# Patient Record
Sex: Female | Born: 1935 | Race: Black or African American | Hispanic: No | Smoking: Never smoker
Health system: Southern US, Community
[De-identification: ages and names within clinical notes are randomized; demographics above are authoritative.]

## PROBLEM LIST (undated history)

## (undated) DIAGNOSIS — I10 Essential (primary) hypertension: Secondary | ICD-10-CM

## (undated) DIAGNOSIS — E119 Type 2 diabetes mellitus without complications: Secondary | ICD-10-CM

## (undated) DIAGNOSIS — I2510 Atherosclerotic heart disease of native coronary artery without angina pectoris: Secondary | ICD-10-CM

## (undated) HISTORY — PX: CORONARY ARTERY BYPASS GRAFT: SHX141

---

## 2016-03-22 ENCOUNTER — Emergency Department (HOSPITAL_COMMUNITY)
Admission: EM | Admit: 2016-03-22 | Discharge: 2016-03-22 | Disposition: A | Discharge status: Home / Self Care | Payer: Medicare HMO | Attending: Emergency Medicine | Admitting: Emergency Medicine

## 2016-03-22 ENCOUNTER — Encounter (HOSPITAL_COMMUNITY): Payer: Self-pay | Admitting: Emergency Medicine

## 2016-03-22 ENCOUNTER — Emergency Department (HOSPITAL_COMMUNITY): Payer: Medicare HMO

## 2016-03-22 DIAGNOSIS — K5641 Fecal impaction: Secondary | ICD-10-CM | POA: Diagnosis not present

## 2016-03-22 DIAGNOSIS — S20211A Contusion of right front wall of thorax, initial encounter: Secondary | ICD-10-CM | POA: Diagnosis not present

## 2016-03-22 DIAGNOSIS — S299XXA Unspecified injury of thorax, initial encounter: Secondary | ICD-10-CM | POA: Diagnosis present

## 2016-03-22 DIAGNOSIS — Z951 Presence of aortocoronary bypass graft: Secondary | ICD-10-CM | POA: Diagnosis not present

## 2016-03-22 DIAGNOSIS — Y929 Unspecified place or not applicable: Secondary | ICD-10-CM | POA: Insufficient documentation

## 2016-03-22 DIAGNOSIS — I10 Essential (primary) hypertension: Secondary | ICD-10-CM | POA: Diagnosis not present

## 2016-03-22 DIAGNOSIS — Y999 Unspecified external cause status: Secondary | ICD-10-CM | POA: Insufficient documentation

## 2016-03-22 DIAGNOSIS — Y939 Activity, unspecified: Secondary | ICD-10-CM | POA: Diagnosis not present

## 2016-03-22 DIAGNOSIS — I2510 Atherosclerotic heart disease of native coronary artery without angina pectoris: Secondary | ICD-10-CM | POA: Insufficient documentation

## 2016-03-22 DIAGNOSIS — W19XXXA Unspecified fall, initial encounter: Secondary | ICD-10-CM | POA: Insufficient documentation

## 2016-03-22 DIAGNOSIS — K5900 Constipation, unspecified: Secondary | ICD-10-CM

## 2016-03-22 HISTORY — DX: Essential (primary) hypertension: I10

## 2016-03-22 HISTORY — DX: Atherosclerotic heart disease of native coronary artery without angina pectoris: I25.10

## 2016-03-22 HISTORY — DX: Type 2 diabetes mellitus without complications: E11.9

## 2016-03-22 MED ORDER — DOCUSATE SODIUM 100 MG PO CAPS: 100.0000 mg | ORAL_CAPSULE | Freq: Two times a day (BID) | ORAL | Status: AC

## 2016-03-22 NOTE — ED Provider Notes (Signed)
CSN: [id number]     Arrival date & time 03/22/16  0233 History   By signing my name below, I, Carol Carr, attest that this documentation has been prepared under the direction and in the presence of Rolland PorterMark Kel Senn, MD. Electronically Signed: Bethel BornBritney Carr, ED Scribe. 03/22/2016. 3:17 AM   Chief Complaint  Patient presents with  . Constipation  . Fall    The history is provided by the patient and a relative. No language interpreter was used.   Carol Carr is a 81 y.o. female who presents to the Emergency Department complaining of constipation with onset 1 week ago. She passed a small amount of loose stool today but had passed nothing else this week. Associated symptoms include rectal pain but no rectal bleeding. Pt denies any abdominal pain.  Pt also complains of right rib pain that started gradually after a fall last afternoon. She had no head injury or LOC.    Past Medical History  Diagnosis Date  . Coronary artery disease   . Diabetes mellitus without complication (HCC)   . Hypertension    Past Surgical History  Procedure Laterality Date  . Coronary artery bypass graft     No family history on file. Social History  Substance Use Topics  . Smoking status: Never Smoker   . Smokeless tobacco: Not on file  . Alcohol Use: Yes   OB History    No data available     Review of Systems  Constitutional: Negative for fever, chills, diaphoresis, appetite change and fatigue.  HENT: Negative for mouth sores, sore throat and trouble swallowing.   Eyes: Negative for visual disturbance.  Respiratory: Negative for cough, chest tightness, shortness of breath and wheezing.   Gastrointestinal: Positive for constipation and rectal pain. Negative for nausea, vomiting, abdominal pain, diarrhea, blood in stool, abdominal distention and anal bleeding.  Endocrine: Negative for polydipsia, polyphagia and polyuria.  Genitourinary: Negative for dysuria, frequency and hematuria.   Musculoskeletal: Negative for gait problem.       Right sided rib pain   Skin: Negative for color change, pallor and rash.  Neurological: Negative for dizziness, syncope, light-headedness and headaches.  Hematological: Does not bruise/bleed easily.  Psychiatric/Behavioral: Negative for behavioral problems and confusion.      Allergies  Review of patient's allergies indicates no known allergies.  Home Medications   Prior to Admission medications   Medication Sig Start Date End Date Taking? Authorizing Provider  docusate sodium (COLACE) 100 MG capsule Take 1 capsule (100 mg total) by mouth every 12 (twelve) hours. 03/22/16   Rolland PorterMark Jeryl Wilbourn, MD   BP 173/74 mmHg  Pulse 88  Temp(Src) 98.8 F (37.1 C) (Oral)  Resp 16  Ht 5\' 6"  (1.676 m)  Wt 187 lb (84.823 kg)  BMI 30.20 kg/m2  SpO2 96% Physical Exam  Constitutional: She is oriented to person, place, and time. She appears well-developed and well-nourished. No distress.  HENT:  Head: Normocephalic.  Eyes: Conjunctivae are normal. Pupils are equal, round, and reactive to light. No scleral icterus.  Neck: Normal range of motion. Neck supple. No thyromegaly present.  Cardiovascular: Normal rate and regular rhythm.  Exam reveals no gallop and no friction rub.   No murmur heard. Pulmonary/Chest: Effort normal and breath sounds normal. No respiratory distress. She has no wheezes. She has no rales. She exhibits tenderness.  Tenderness at right lower ribs   Abdominal: Soft. Bowel sounds are normal. She exhibits no distension. There is no tenderness. There is no rebound.  Genitourinary:  Large hard stool distending the anal verge   Musculoskeletal: Normal range of motion.  Neurological: She is alert and oriented to person, place, and time.  Skin: Skin is warm and dry. No rash noted.  Psychiatric: She has a normal mood and affect. Her behavior is normal.    ED Course  Procedures (including critical care time) DIAGNOSTIC STUDIES: Oxygen  Saturation is 96% on RA,  normal by my interpretation.    COORDINATION OF CARE: 3:10 AM Discussed treatment plan which includes fecal disimpaction and rib XR with pt at bedside and pt agreed to plan.  Labs Review Labs Reviewed - No data to display  Imaging Review Dg Ribs Unilateral W/chest Right  03/22/2016  CLINICAL DATA:  Status post fall, with right anterior lower rib pain. Initial encounter. EXAM: RIGHT RIBS AND CHEST - 3+ VIEW COMPARISON:  None. FINDINGS: No displaced rib fractures are seen. The lungs are well-aerated. Mild vascular congestion is noted. Mild left midlung atelectasis is noted. There is no evidence of pleural effusion or pneumothorax. The cardiomediastinal silhouette is borderline normal in size. The patient is status post median sternotomy. No acute osseous abnormalities are seen. IMPRESSION: No displaced rib fracture seen. Mild vascular congestion noted. Mild left midlung atelectasis seen. Electronically Signed   By: Roanna RaiderJeffery  Chang M.D.   On: 03/22/2016 04:05   I have personally reviewed and evaluated these images and lab results as part of my medical decision-making.   EKG Interpretation None      MDM   Final diagnoses:  Fecal impaction (HCC)  Rib contusion, right, initial encounter  Constipation, unspecified constipation type   Patient disimpacted of a large volume of stool. There is able to have bowel movement at the bedside. Chest x-ray shows no pneumothorax or rib fractures. Plan home, Motrin or Tylenol as needed. Colace. Stay hydrated. ER with acute changes. She reports her symptoms are "100% gone".   I personally performed the services described in this documentation, which was scribed in my presence. The recorded information has been reviewed and is accurate.    Rolland PorterMark Shaqueena Mauceri, MD 03/22/16 [phone]

## 2016-03-22 NOTE — ED Notes (Signed)
MD at bedside. 

## 2016-03-22 NOTE — ED Notes (Signed)
Pt. reports constipation for several days  , fell this evening with no LOC , reports pain at right lateral ribcage worse with movement . Respirations unlabored .

## 2016-03-22 NOTE — Discharge Instructions (Signed)
Chest Contusion °A contusion is a deep bruise. Bruises happen when an injury causes bleeding under the skin. Signs of bruising include pain, puffiness (swelling), and discolored skin. The bruise may turn blue, purple, or yellow.  °HOME CARE °· Put ice on the injured area. °¨ Put ice in a plastic bag. °¨ Place a towel between the skin and the bag. °¨ Leave the ice on for 15-20 minutes at a time, 03-04 times a day for the first 48 hours. °· Only take medicine as told by your doctor. °· Rest. °· Take deep breaths (deep-breathing exercises) as told by your doctor. °· Stop smoking if you smoke. °· Do not lift objects over 5 pounds (2.3 kilograms) for 3 days or longer if told by your doctor. °GET HELP RIGHT AWAY IF:  °· You have more bruising or puffiness. °· You have pain that gets worse. °· You have trouble breathing. °· You are dizzy, weak, or pass out (faint). °· You have blood in your pee (urine) or poop (stool). °· You cough up or throw up (vomit) blood. °· Your puffiness or pain is not helped with medicines. °MAKE SURE YOU:  °· Understand these instructions. °· Will watch your condition. °· Will get help right away if you are not doing well or get worse. °  °This information is not intended to replace advice given to you by your health care provider. Make sure you discuss any questions you have with your health care provider. °  °Document Released: 02/23/2008 Document Revised: 05/31/2012 Document Reviewed: 02/28/2012 °[employer]vier Interactive Patient Education ©2016 [employer] ° °

## 2016-03-22 NOTE — ED Notes (Signed)
Patient transported to X-ray 

## 2016-11-30 IMAGING — DX DG RIBS W/ CHEST 3+V*R*
3 series · 3 of 3 positions shown · non-contrast
Comparison: None.

CLINICAL DATA: Status post fall, with right anterior lower rib
pain. Initial encounter.

EXAM:
RIGHT RIBS AND CHEST - 3+ VIEW

[chest pa]
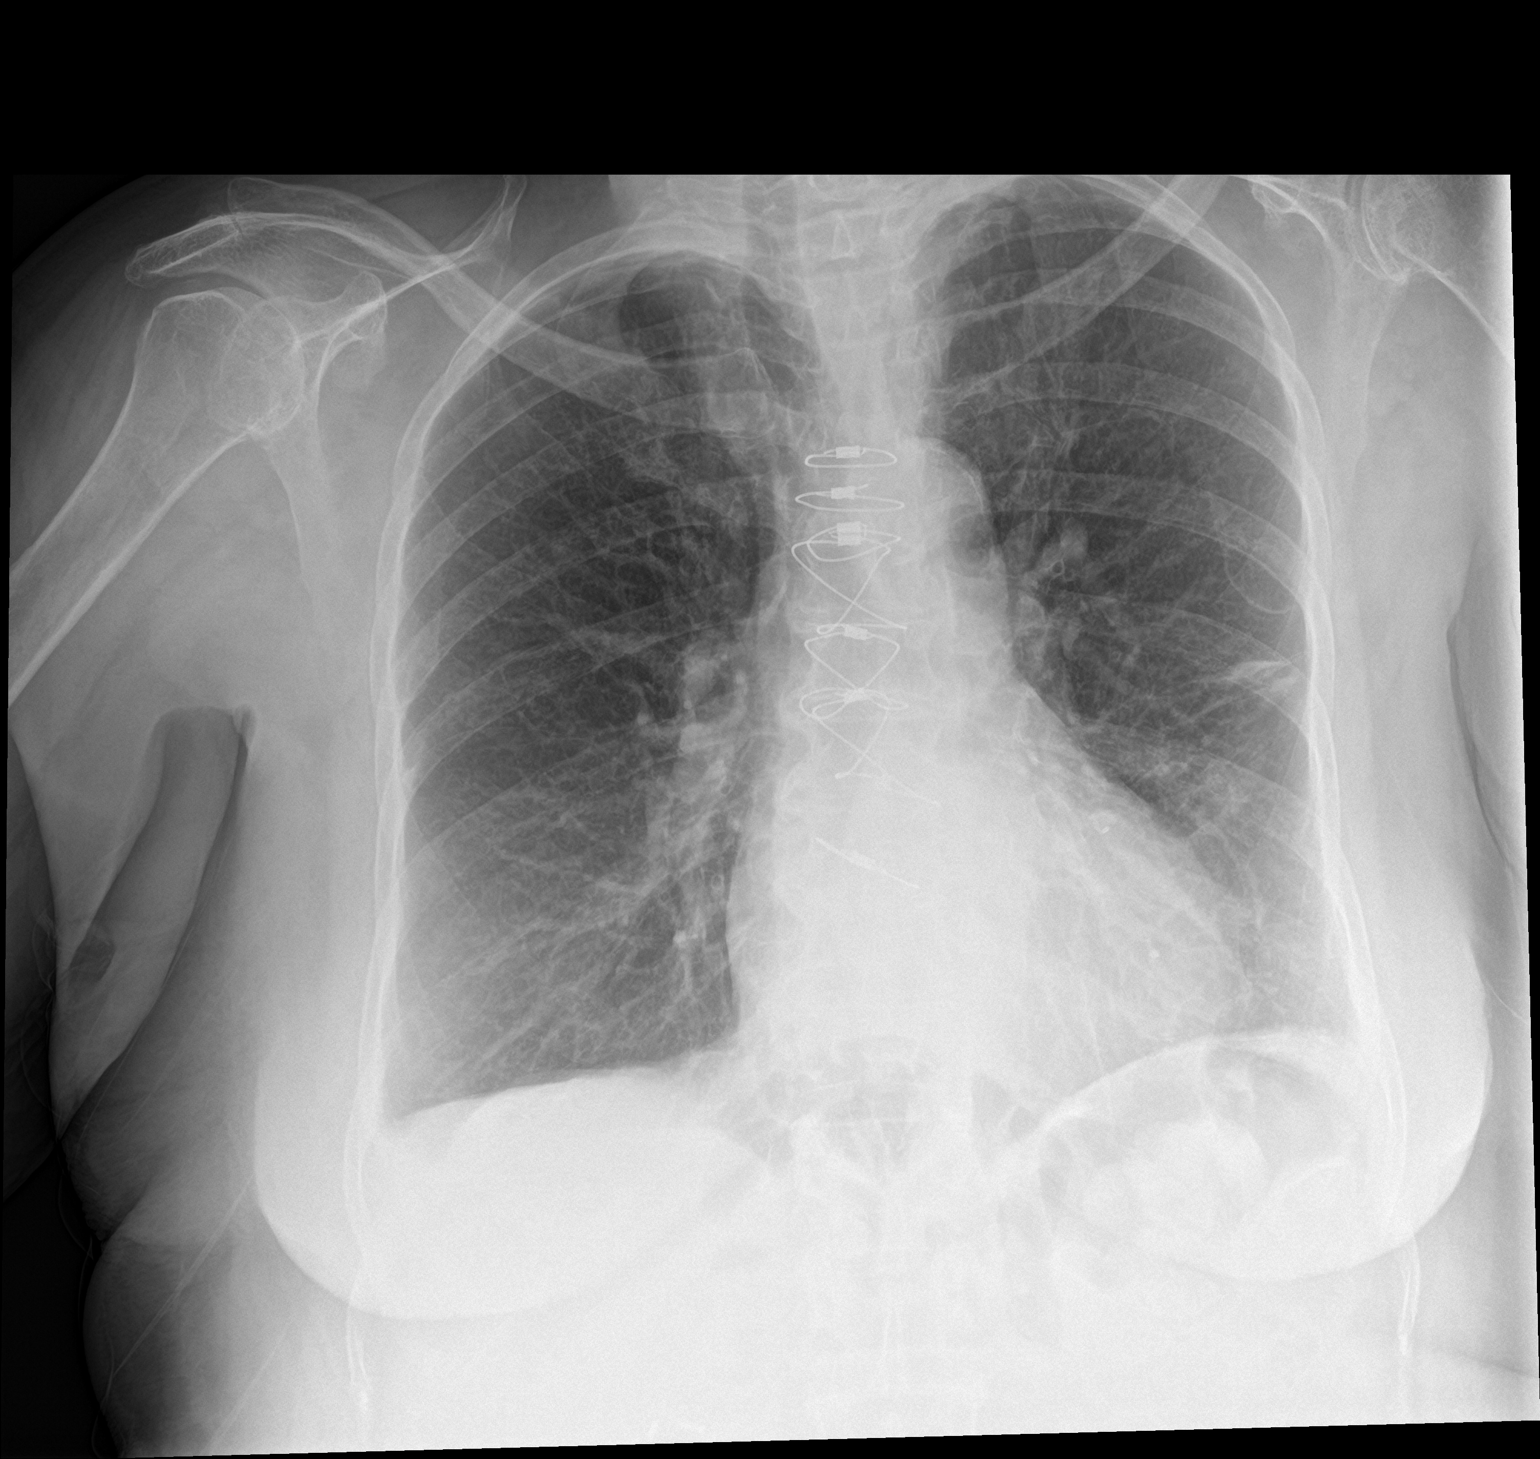

[rib ap]
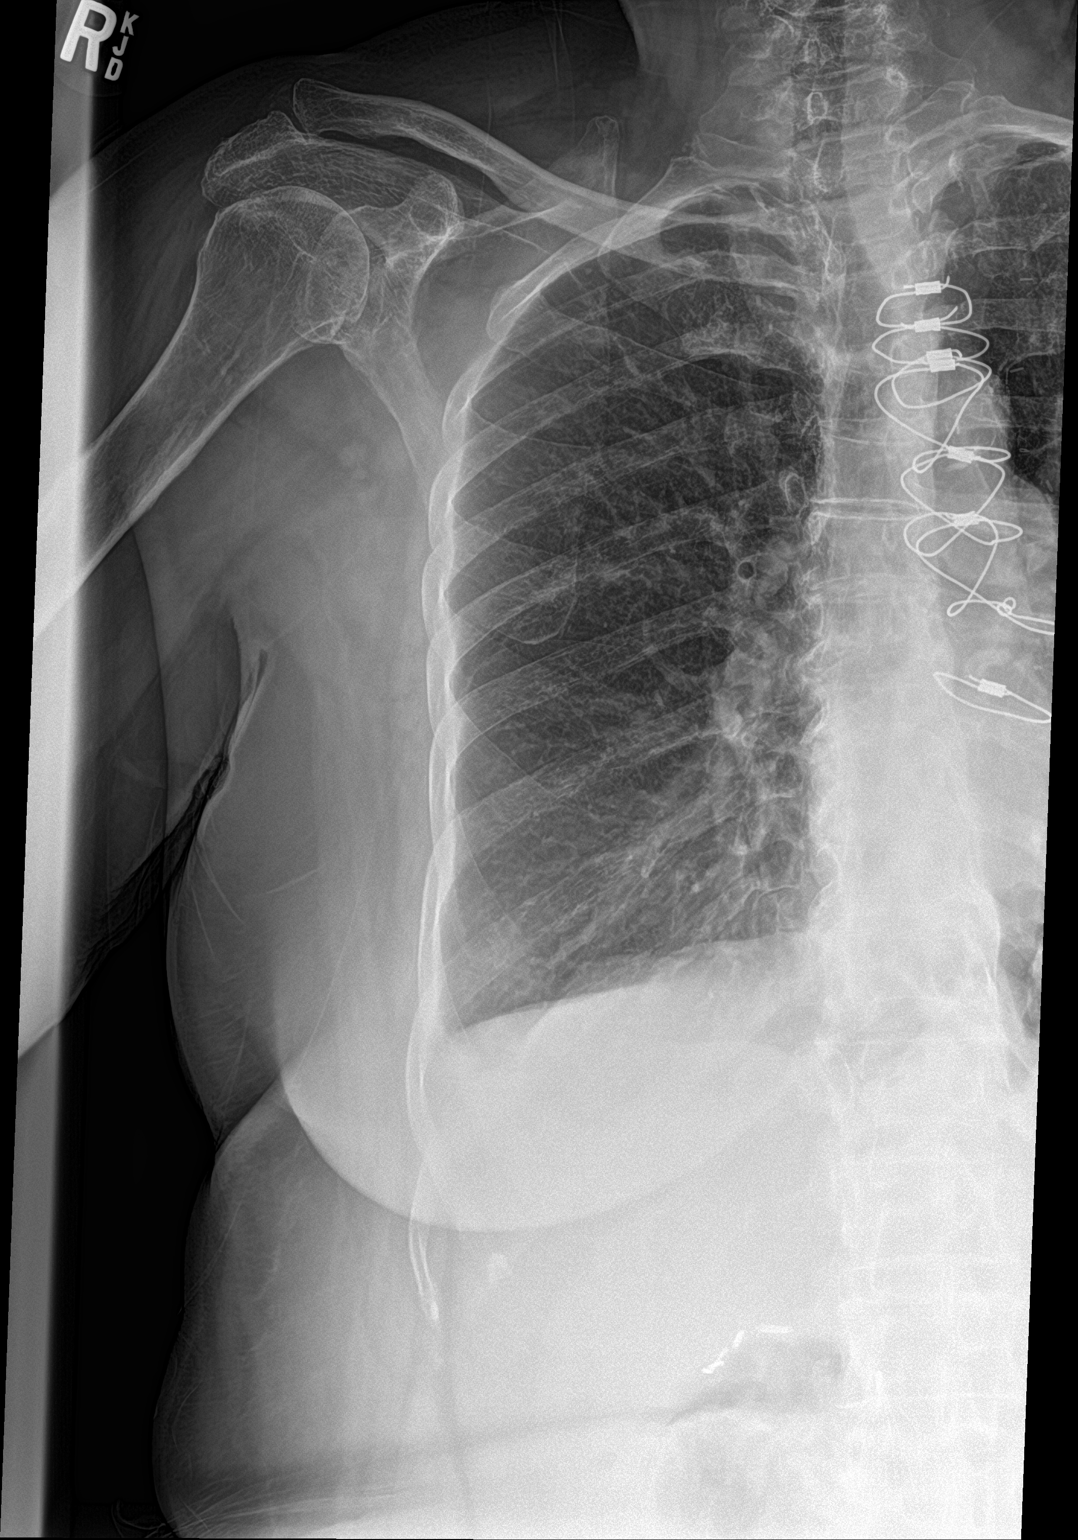

[rib ap obl]
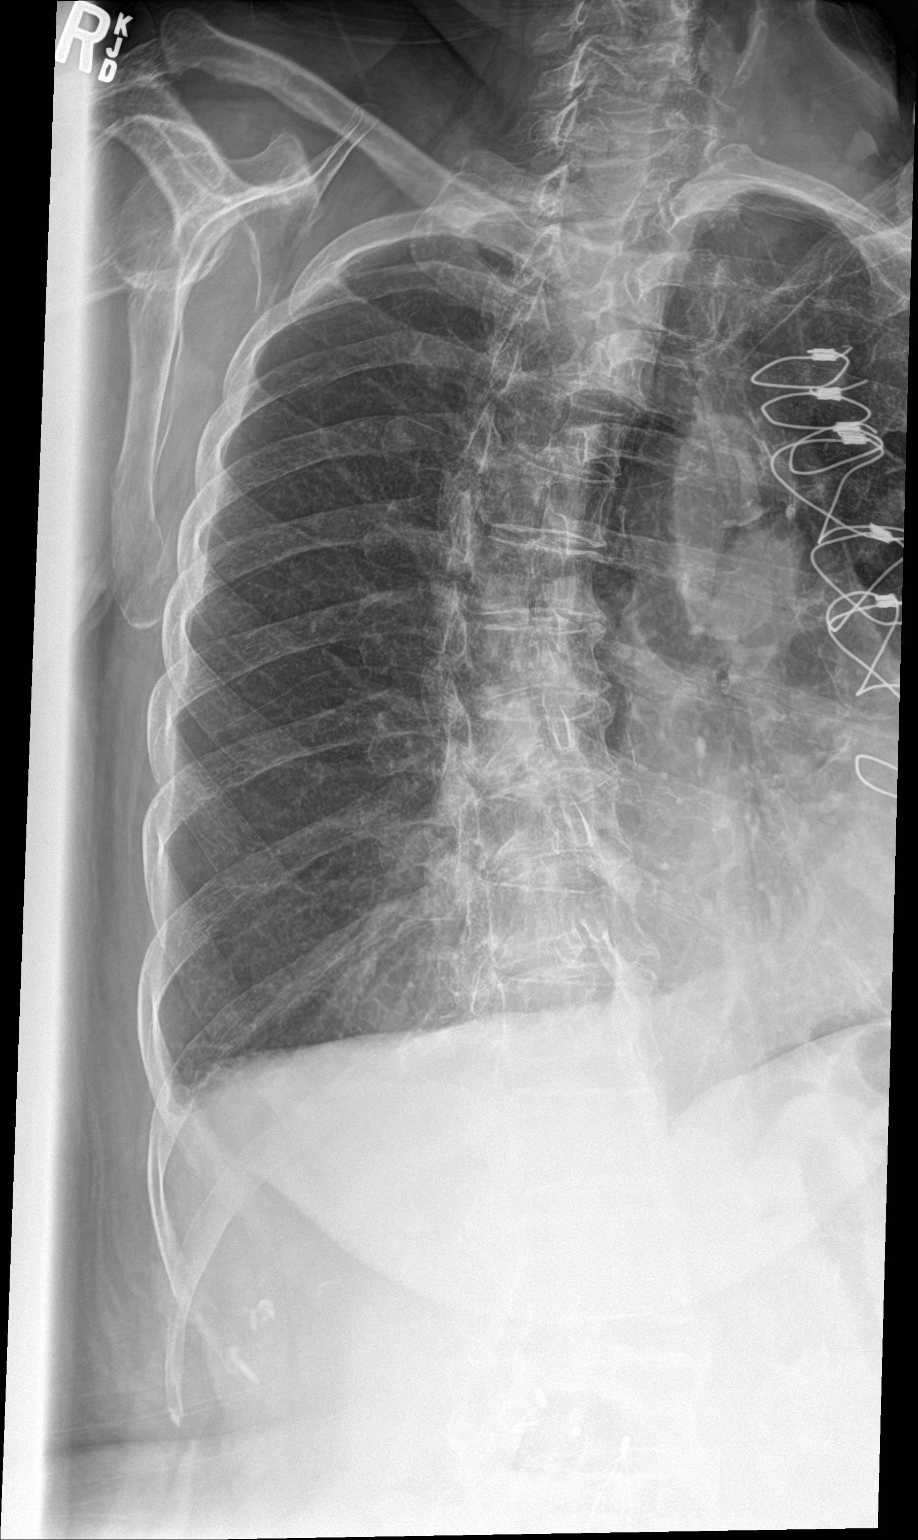

[3 of 3 positions shown; findings below may reference images not displayed]

FINDINGS: No displaced rib fractures are seen.

The lungs are well-aerated. Mild vascular congestion is noted. Mild
left midlung atelectasis is noted. There is no evidence of pleural
effusion or pneumothorax.

The cardiomediastinal silhouette is borderline normal in size. The
patient is status post median sternotomy. No acute osseous
abnormalities are seen.
IMPRESSION: No displaced rib fracture seen. Mild vascular congestion noted. Mild
left midlung atelectasis seen.

## 2018-02-18 DEATH — deceased
